# Patient Record
Sex: Female | Born: 2000 | Race: White | Hispanic: No | Marital: Single | Smoking: Current every day smoker
Health system: Southern US, Community
[De-identification: ages and names within clinical notes are randomized; demographics above are authoritative.]

## PROBLEM LIST (undated history)

## (undated) DIAGNOSIS — N946 Dysmenorrhea, unspecified: Secondary | ICD-10-CM

## (undated) DIAGNOSIS — F4310 Post-traumatic stress disorder, unspecified: Secondary | ICD-10-CM

## (undated) DIAGNOSIS — F419 Anxiety disorder, unspecified: Secondary | ICD-10-CM

## (undated) DIAGNOSIS — N83209 Unspecified ovarian cyst, unspecified side: Secondary | ICD-10-CM

## (undated) DIAGNOSIS — F909 Attention-deficit hyperactivity disorder, unspecified type: Secondary | ICD-10-CM

---
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
## 2018-03-28 IMAGING — US US TRANSVAGINAL NON-OB
1 series · 13 of 25 positions shown · non-contrast
Comparison: 10/22/2016

CLINICAL DATA: Pelvic pain for several hours

EXAM:
TRANSABDOMINAL AND TRANSVAGINAL ULTRASOUND OF PELVIS
DOPPLER ULTRASOUND OF OVARIES
TECHNIQUE: Both transabdominal and transvaginal ultrasound examinations of the
pelvis were performed. Transabdominal technique was performed for
global imaging of the pelvis including uterus, ovaries, adnexal
regions, and pelvic cul-de-sac.
It was necessary to proceed with endovaginal exam following the
transabdominal exam to visualize the ovaries. Color and duplex
Doppler ultrasound was utilized to evaluate blood flow to the
ovaries.

[Series 1: us transvaginal non-ob · 0.25mm/px · 13 of 99 slices shown]
[im 1/99]
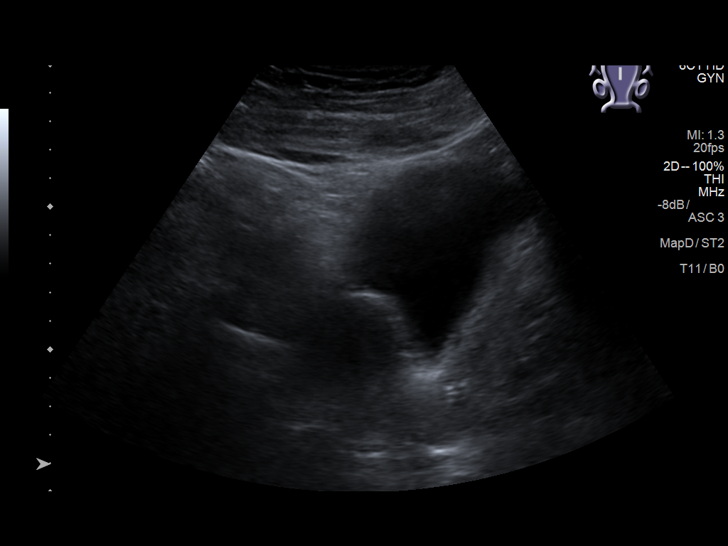
[im 9/99]
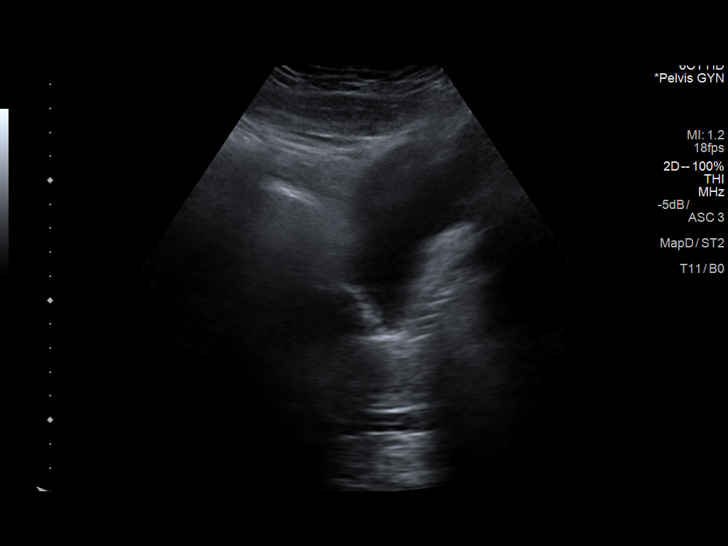
[im 17/99]
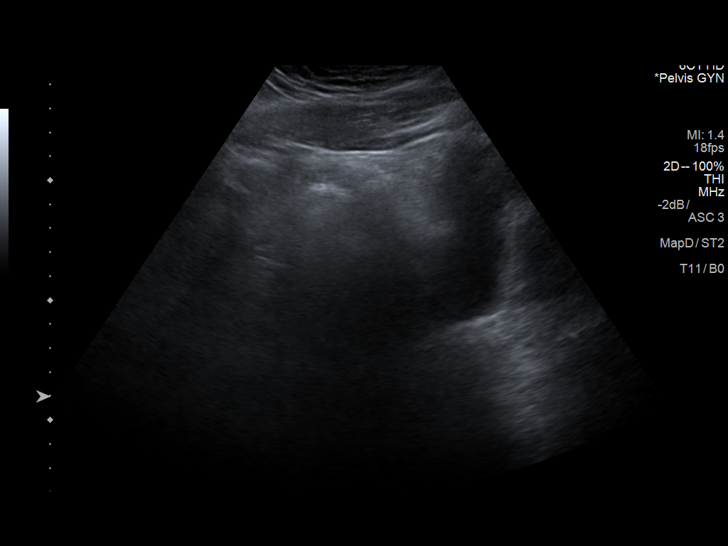
[im 25/99]
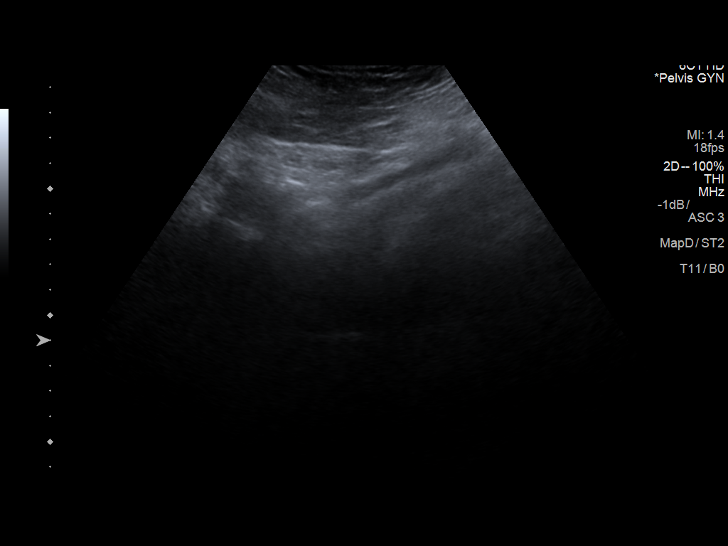
[im 33/99]
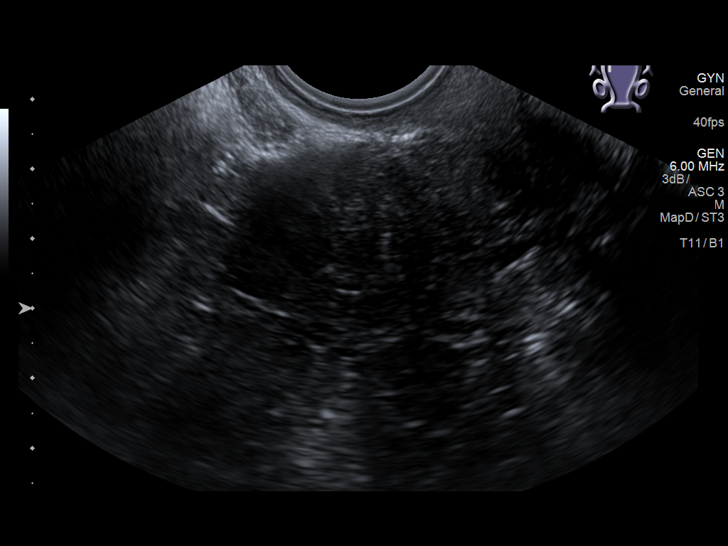
[im 41/99]
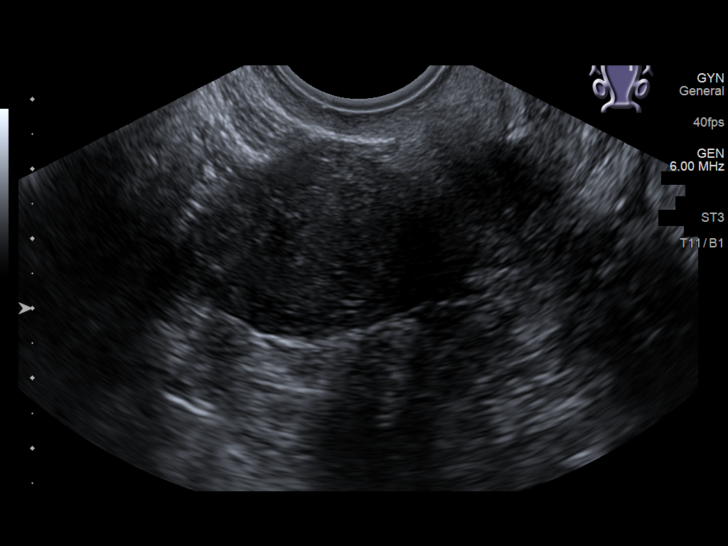
[im 50/99]
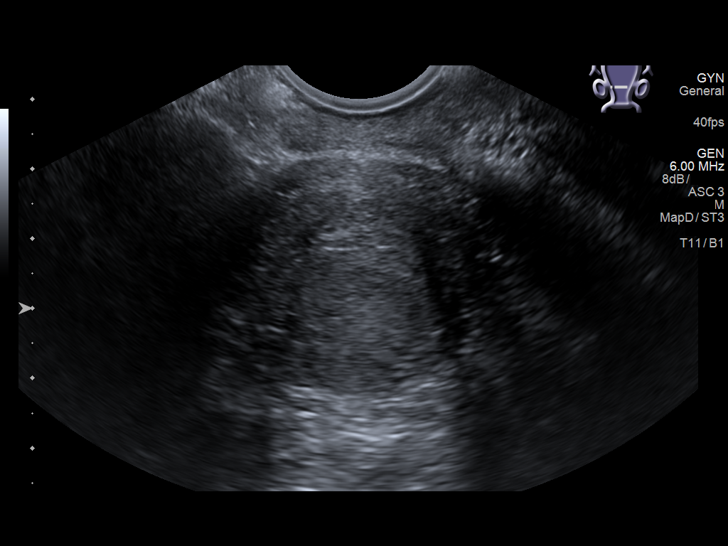
[im 58/99]
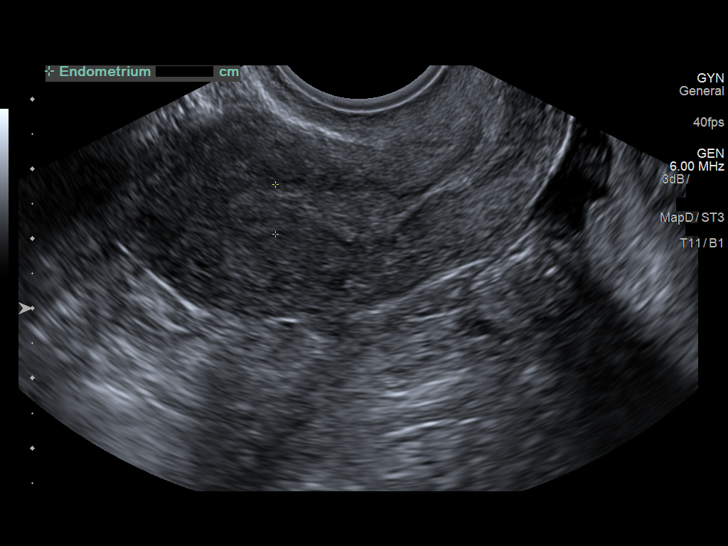
[im 66/99]
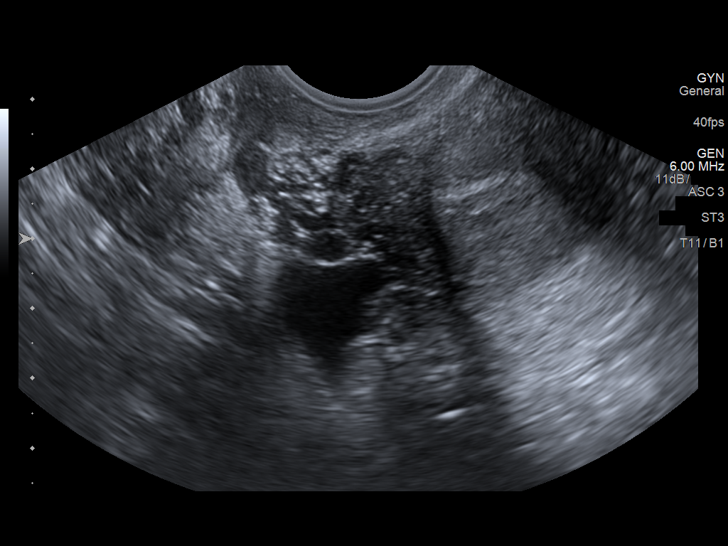
[im 74/99]
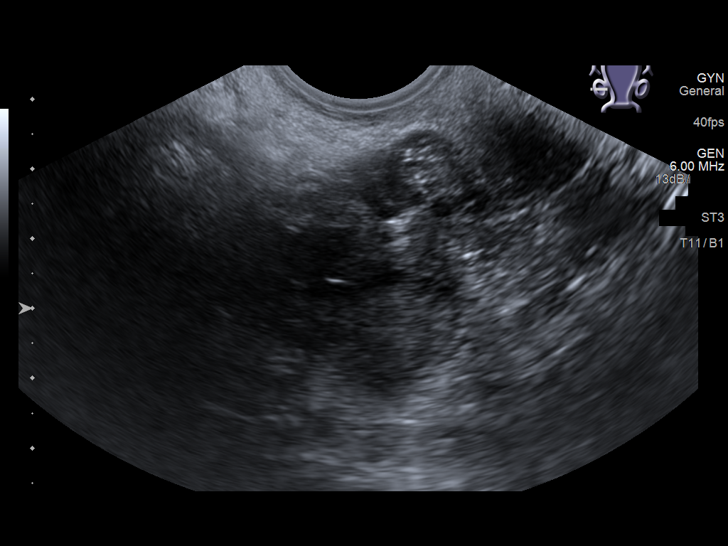
[im 82/99]
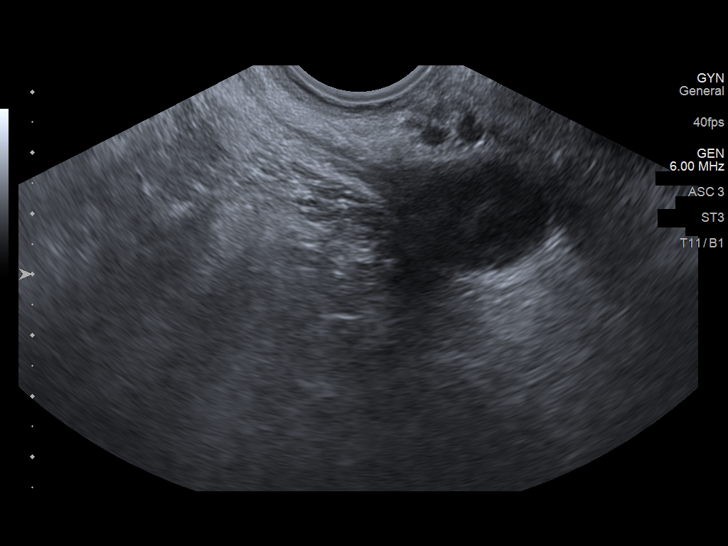
[im 90/99]
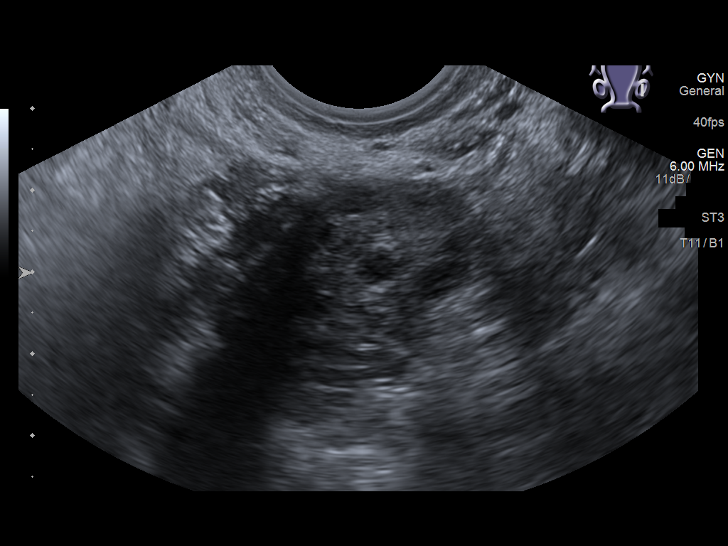
[im 99/99]
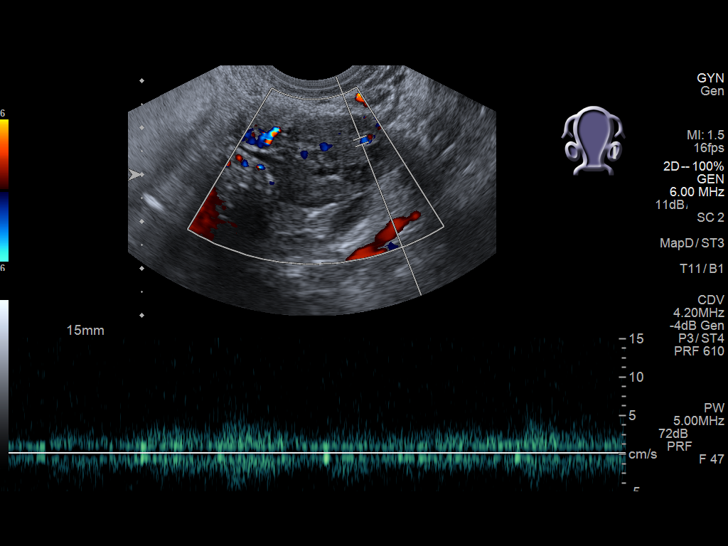

[13 of 25 positions shown; findings below may reference images not displayed]

FINDINGS: Uterus

Measurements: 6.8 x 3.4 x 5.0 cm.. No fibroids or other mass
visualized.

Endometrium

Thickness: 7 mm..  No focal abnormality visualized.

Right ovary

Measurements: 3.2 x 1.7 x 2.8 cm.. Normal appearance/no adnexal
mass.

Left ovary

Measurements: 3.6 x 2.1 x 2.3 cm.. Normal appearance/no adnexal
mass.

Pulsed Doppler evaluation of both ovaries demonstrates normal
low-resistance arterial and venous waveforms.

Other findings

Minimal free fluid is noted likely physiologic in nature.
IMPRESSION: No acute abnormality noted. No findings to suggest ovarian torsion
are seen.

## 2019-06-23 IMAGING — CT CT ABD-PELV W/ CM
2 of 4 series · 16 of 46 positions shown, 18 images · IV contrast (APPLIED)
Comparison: None.

CLINICAL DATA: Right lower quadrant pain and vomiting.

EXAM:
CT ABDOMEN AND PELVIS WITH CONTRAST
TECHNIQUE: Multidetector CT imaging of the abdomen and pelvis was performed
using the standard protocol following bolus administration of
intravenous contrast.
CONTRAST:  100mL [id number] IOPAMIDOL ([id number]) INJECTION 61%

[Series 2: routine abd/pel with · axial · 0.72mm/px · z∈[-487,-72]mm · 13 of 91 slices shown, 15 images]
[im 4/91  soft-tissue]
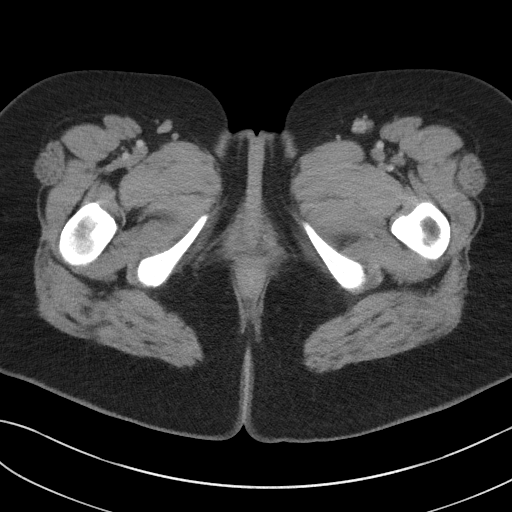
[im 4/91  bone]
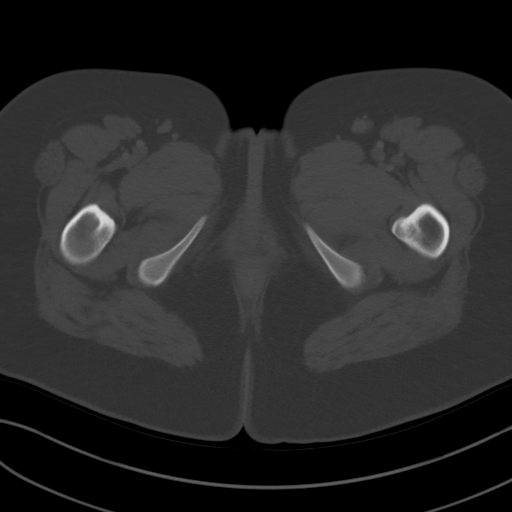
[im 12/91  soft-tissue]
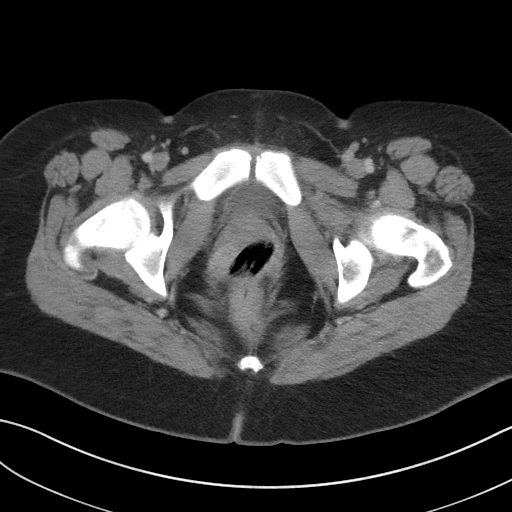
[im 20/91  soft-tissue]
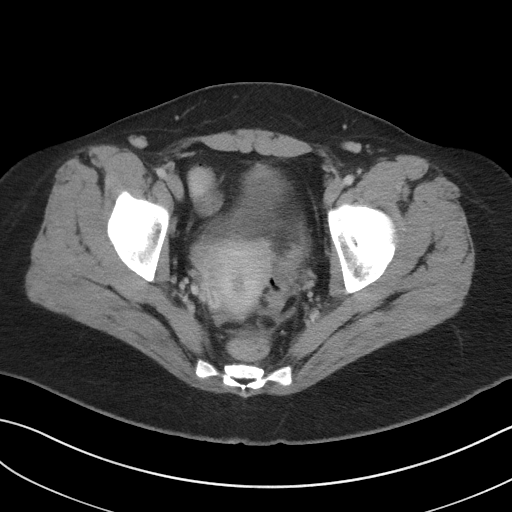
[im 24/91  soft-tissue]
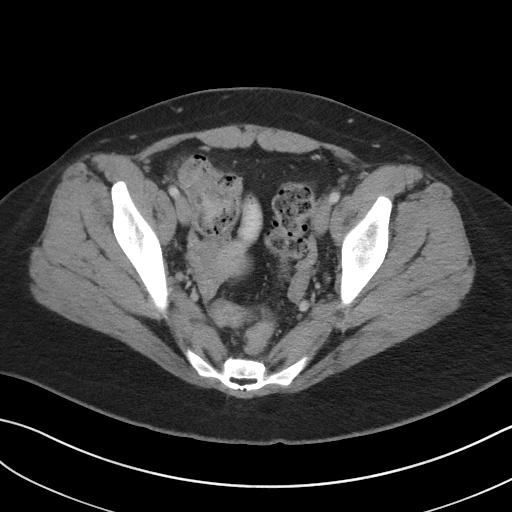
[im 32/91  soft-tissue]
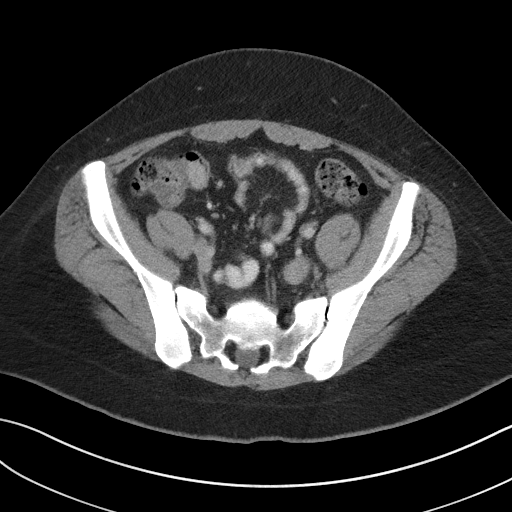
[im 40/91  soft-tissue]
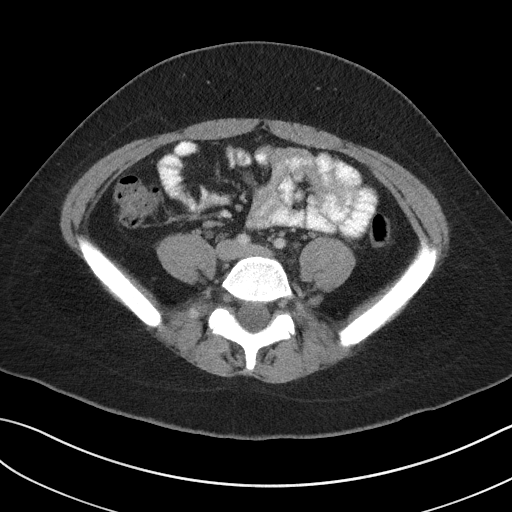
[im 47/91  soft-tissue]
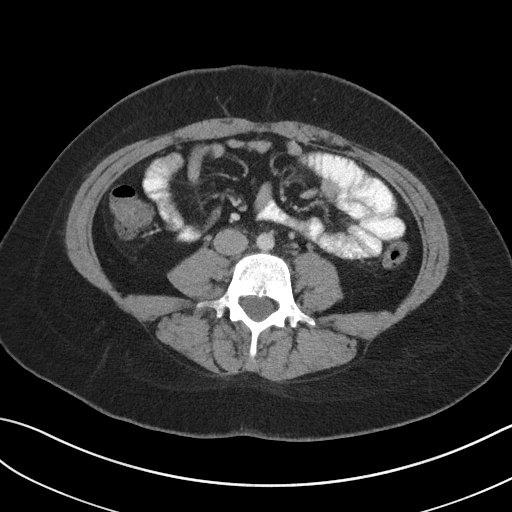
[im 51/91  soft-tissue]
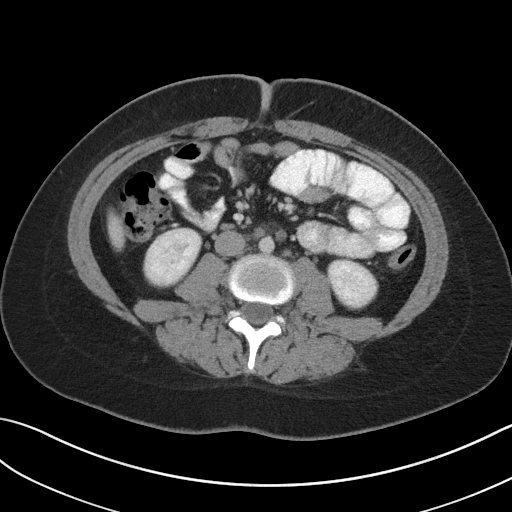
[im 59/91  soft-tissue]
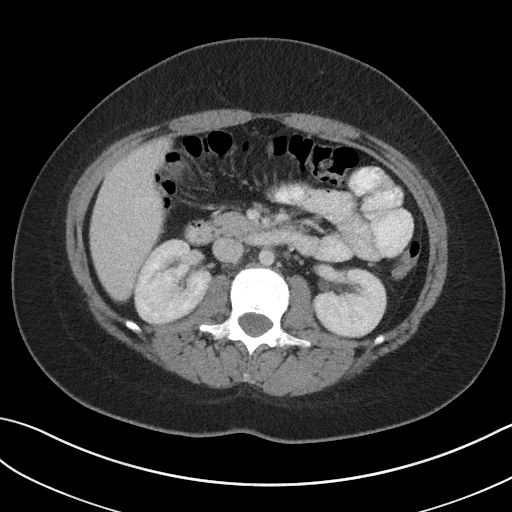
[im 59/91  bone]
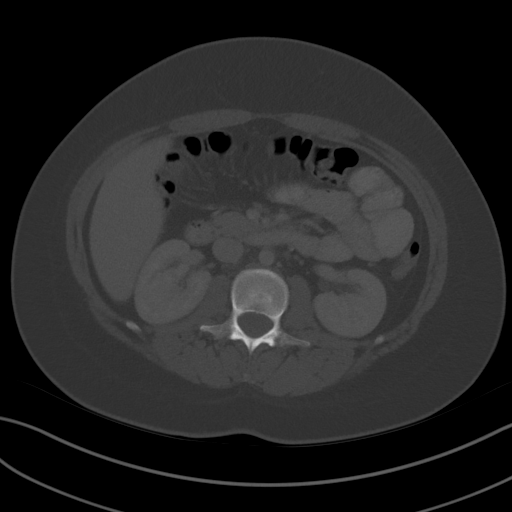
[im 67/91  soft-tissue]
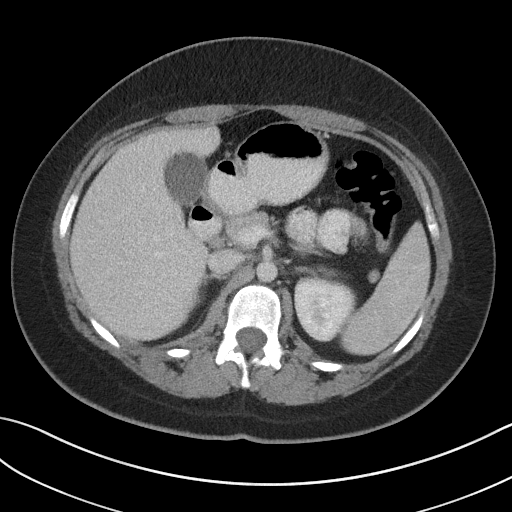
[im 71/91  soft-tissue]
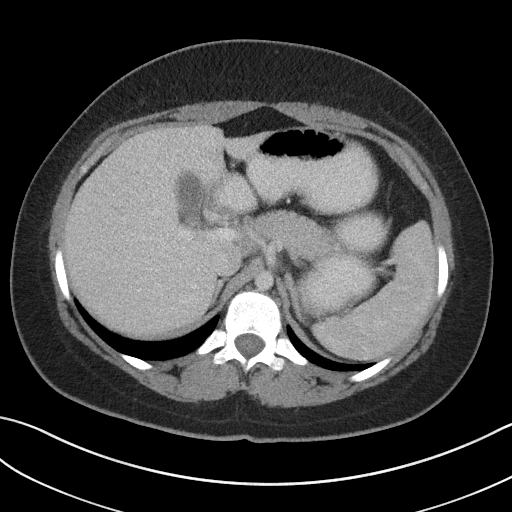
[im 79/91  soft-tissue]
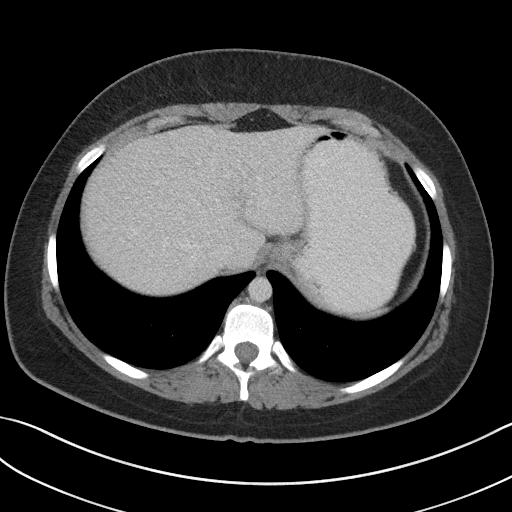
[im 87/91  soft-tissue]
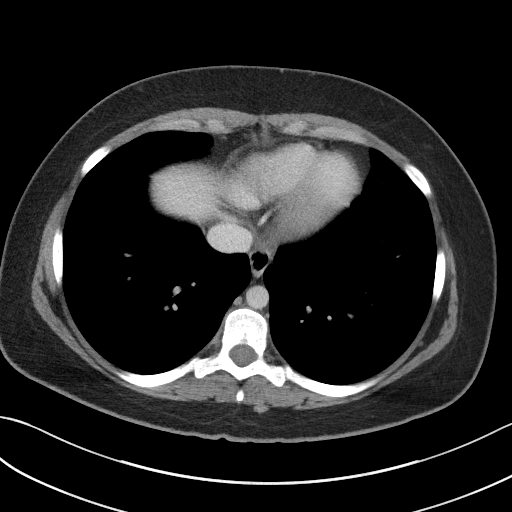

[Series 5: coronal st · coronal · 0.68mm/px · 3 of 94 slices shown]
[im 32/94  soft-tissue]
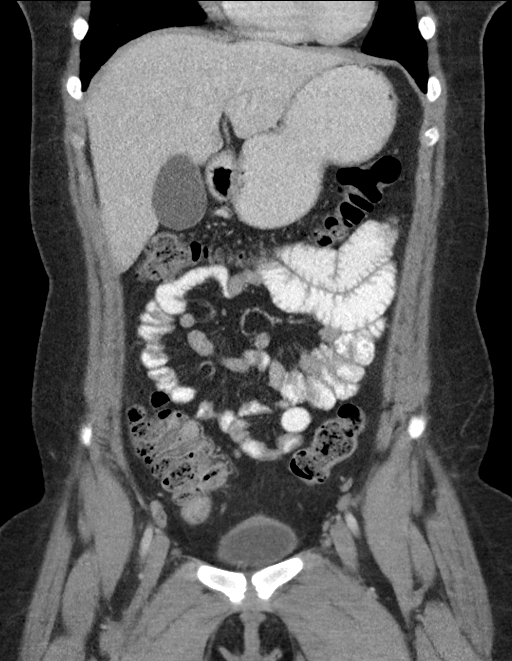
[im 42/94  soft-tissue]
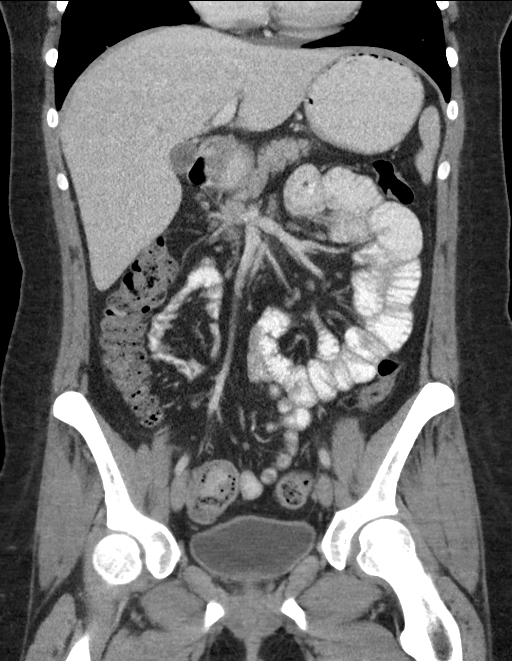
[im 52/94  soft-tissue]
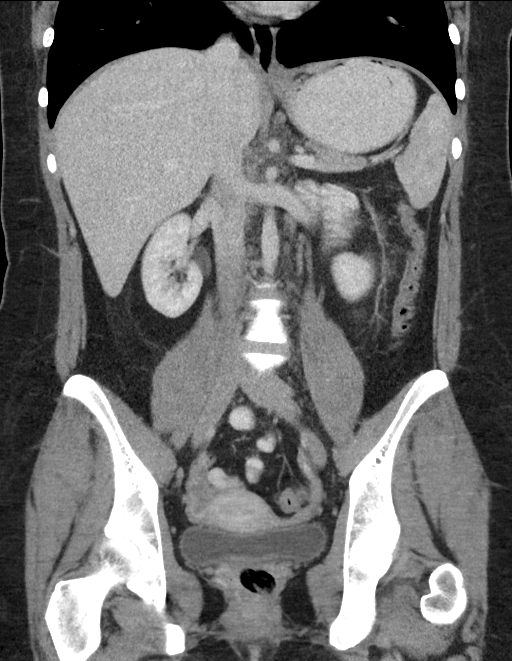

[16 of 46 positions shown; findings below may reference images not displayed]

FINDINGS: Lower chest: The lung bases are clear.

Hepatobiliary: No focal liver abnormality is seen. No gallstones,
gallbladder wall thickening, or biliary dilatation.

Pancreas: No ductal dilatation or inflammation.

Spleen: Normal in size without focal abnormality. Small splenule at
the hilum.

Adrenals/Urinary Tract: Normal adrenal glands. No hydronephrosis. No
perinephric edema. Urinary bladder is physiologically distended. No
wall thickening.

Stomach/Bowel: Stomach distended with ingested contrast. No small
bowel dilatation, inflammation or obstruction. Air-filled normal
appendix visualized in the right lower quadrant. Moderate colonic
stool burden without colonic wall thickening or inflammation.

Vascular/Lymphatic: Normal caliber abdominal aorta. Small
retroperitoneal nodes not enlarged by size criteria. No pelvic
adenopathy.

Reproductive: Uterus and bilateral adnexa are unremarkable. Tampon
in the vagina.

Other: Small amount of fluid in the pelvis may be physiologic or
seen with ruptured ovarian cyst. No upper abdominal ascites. No free
air. No intra-abdominal abscess.

Musculoskeletal: There are no acute or suspicious osseous
abnormalities. Bilateral L5 pars interarticularis defects without
listhesis.
IMPRESSION: 1. Normal appendix.
2. Small volume free fluid in the pelvis may be physiologic or
represent sequela of ruptured ovarian cyst. Ovaries are symmetric
and normal by CT.
3. Bilateral L5 pars defects without listhesis, incidentally noted.

## 2020-05-07 IMAGING — CR DG LUMBAR SPINE 2-3V
3 series · 3 of 3 positions shown · non-contrast
Comparison: None.

CLINICAL DATA: Pain following fall

EXAM:
LUMBAR SPINE - 2-3 VIEW

[l-spine ap]
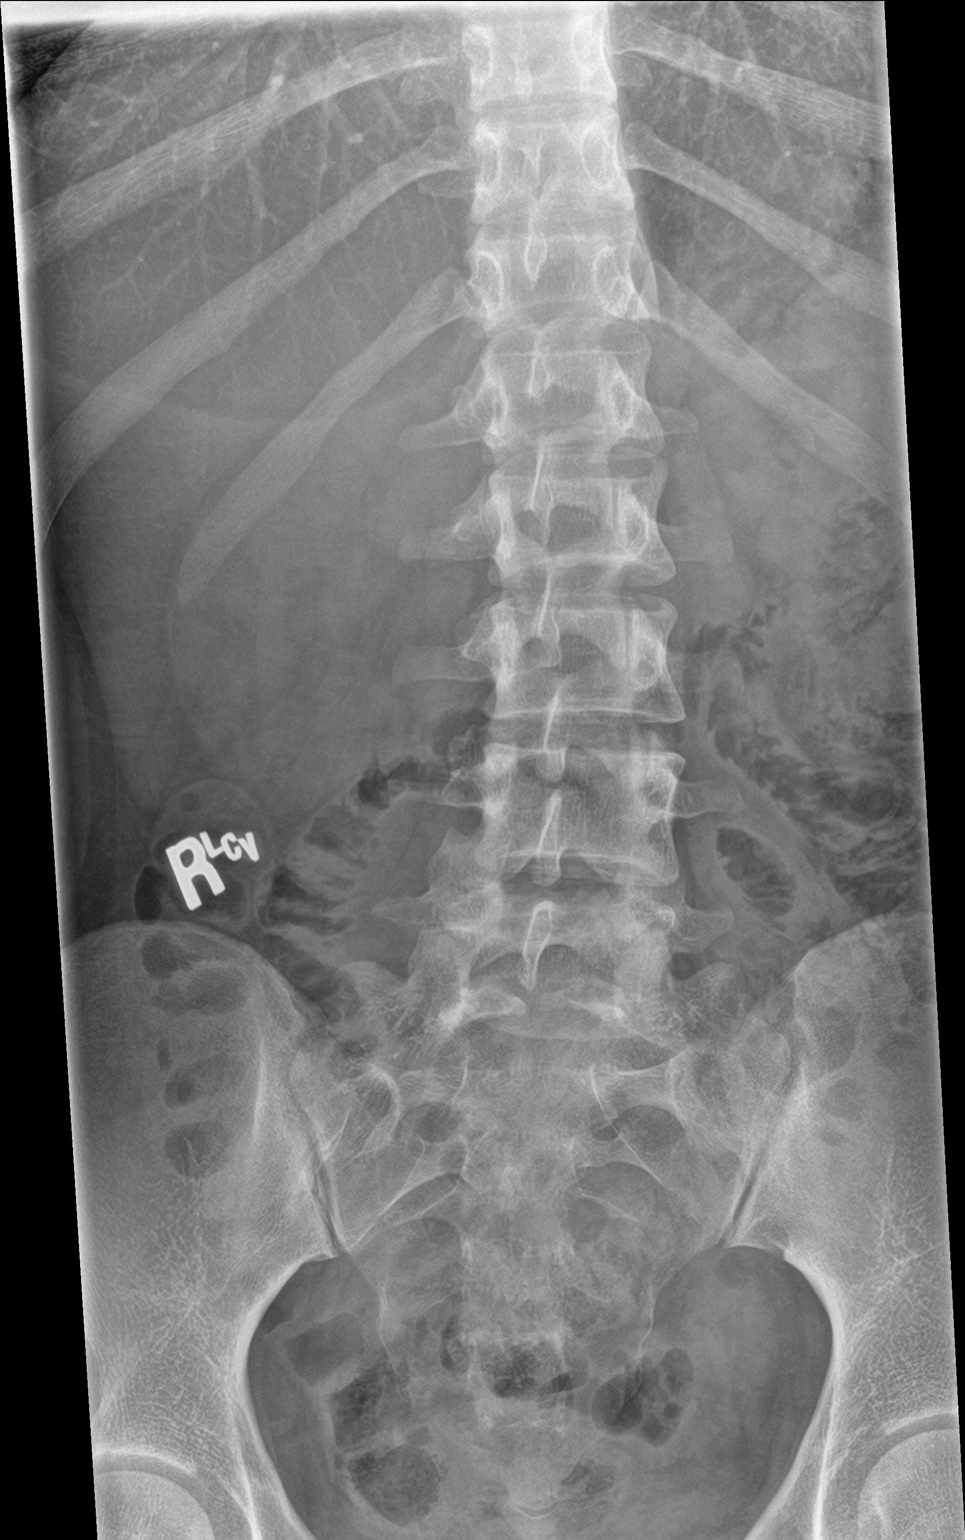

[l-spine lat]
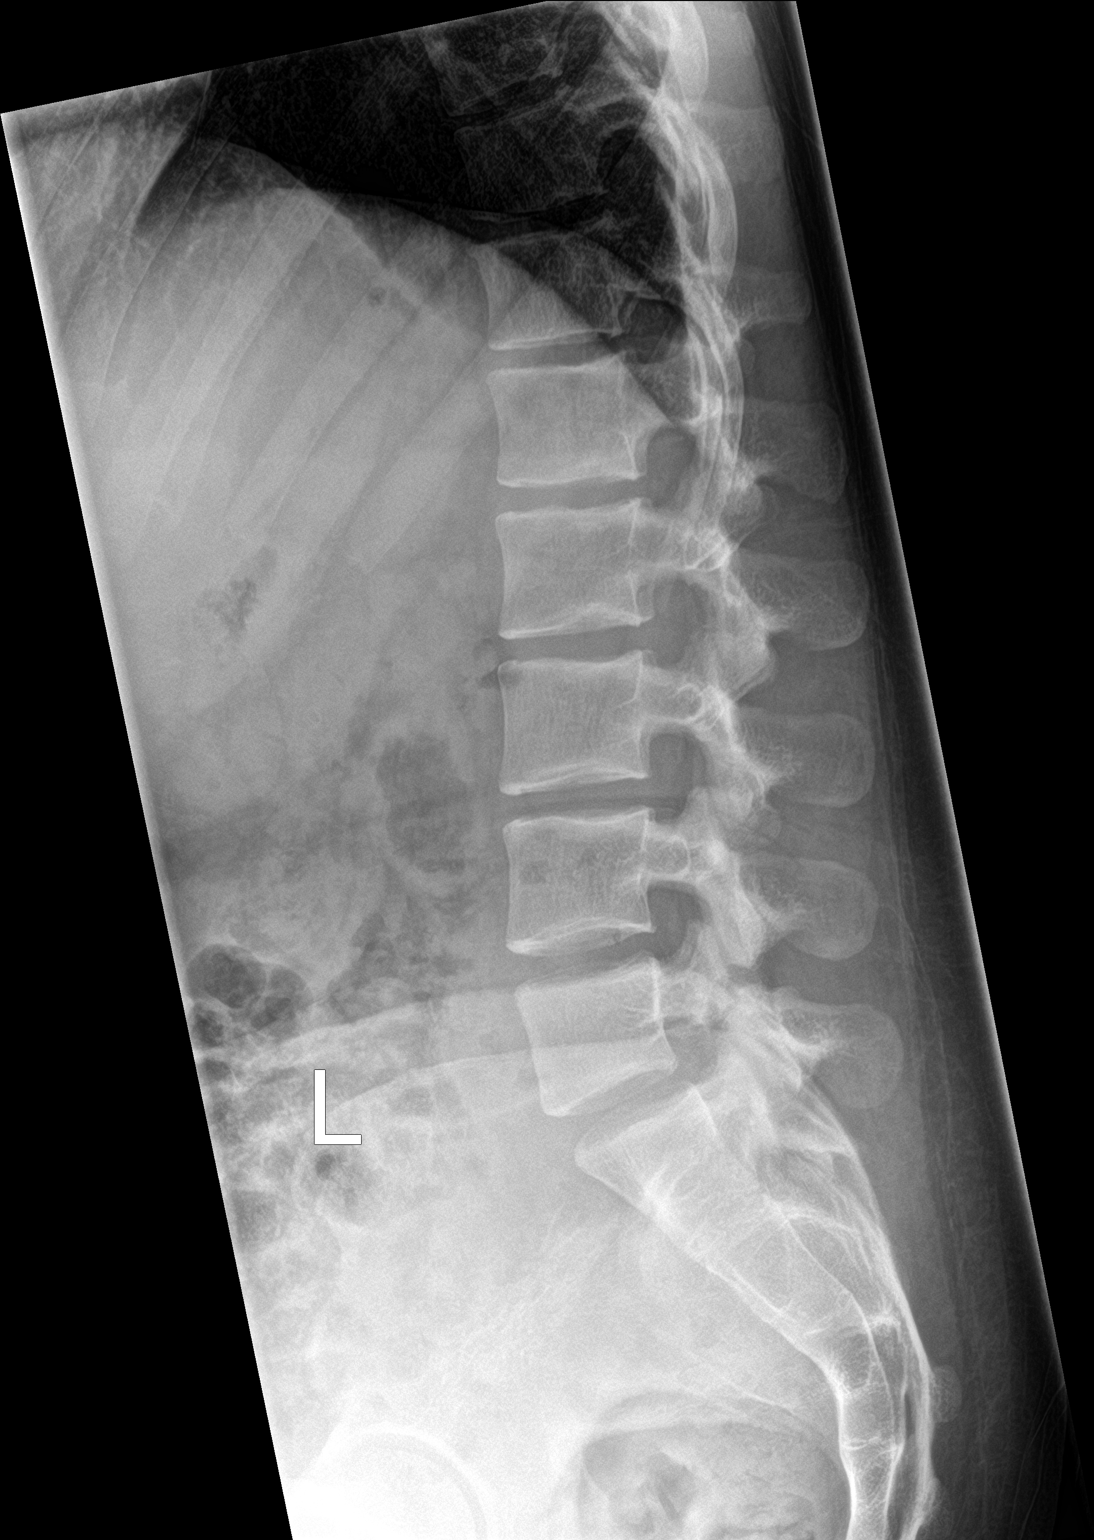

[l-spine spot]
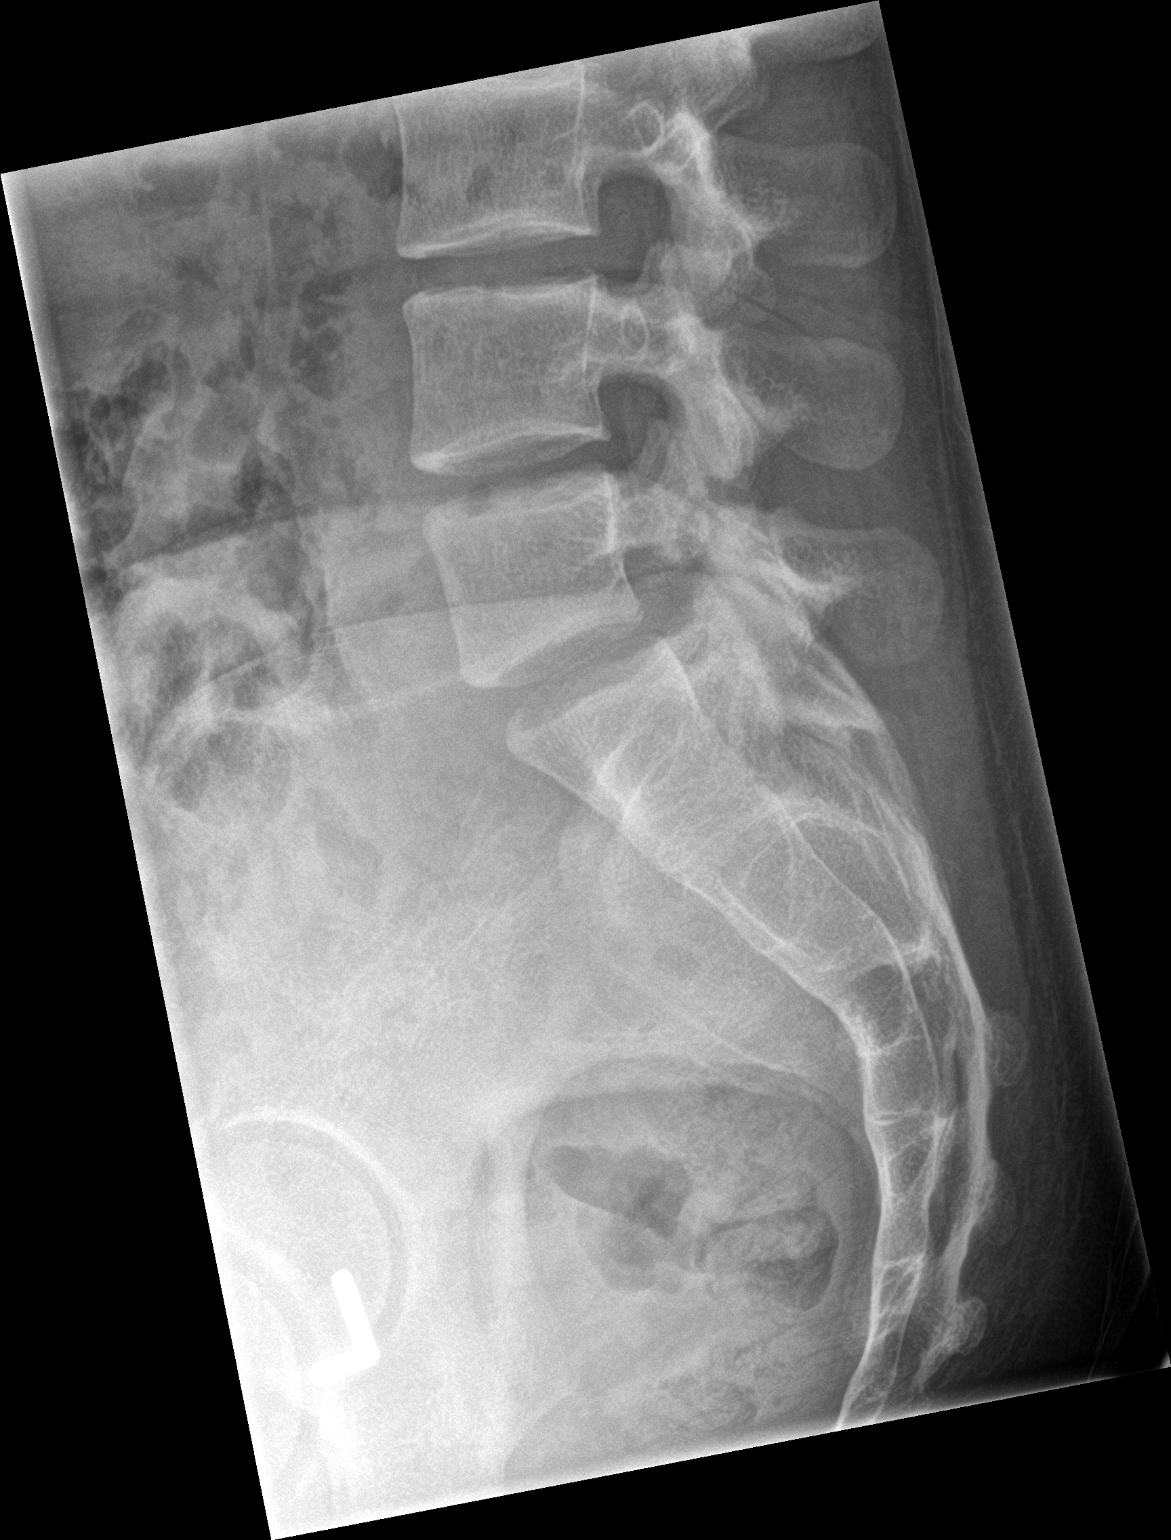

[3 of 3 positions shown; findings below may reference images not displayed]

FINDINGS: Frontal, lateral, and spot lumbosacral lateral images were obtained.
There are 5 non-rib-bearing lumbar type vertebral bodies. There is
mild levoscoliosis with rotatory component. No fracture or
spondylolisthesis. The disc spaces appear normal. No erosive change.
IMPRESSION: Mild scoliosis. No fracture or spondylolisthesis. No appreciable
arthropathy.
